# Patient Record
Sex: Female | Born: 1935 | Race: White | Hispanic: No | Smoking: Never smoker
Health system: Southern US, Community
[De-identification: ages and names within clinical notes are randomized; demographics above are authoritative.]

## PROBLEM LIST (undated history)

## (undated) DIAGNOSIS — J309 Allergic rhinitis, unspecified: Secondary | ICD-10-CM

## (undated) DIAGNOSIS — E119 Type 2 diabetes mellitus without complications: Secondary | ICD-10-CM

## (undated) DIAGNOSIS — E0590 Thyrotoxicosis, unspecified without thyrotoxic crisis or storm: Secondary | ICD-10-CM

## (undated) DIAGNOSIS — J329 Chronic sinusitis, unspecified: Secondary | ICD-10-CM

## (undated) DIAGNOSIS — I10 Essential (primary) hypertension: Secondary | ICD-10-CM

## (undated) DIAGNOSIS — M797 Fibromyalgia: Secondary | ICD-10-CM

## (undated) DIAGNOSIS — M47816 Spondylosis without myelopathy or radiculopathy, lumbar region: Secondary | ICD-10-CM

## (undated) HISTORY — DX: Type 2 diabetes mellitus without complications: E11.9

## (undated) HISTORY — DX: Thyrotoxicosis, unspecified without thyrotoxic crisis or storm: E05.90

## (undated) HISTORY — DX: Chronic sinusitis, unspecified: J32.9

## (undated) HISTORY — DX: Fibromyalgia: M79.7

## (undated) HISTORY — DX: Essential (primary) hypertension: I10

## (undated) HISTORY — DX: Allergic rhinitis, unspecified: J30.9

## (undated) HISTORY — DX: Spondylosis without myelopathy or radiculopathy, lumbar region: M47.816

---

## 1964-07-20 HISTORY — PX: PARTIAL HYSTERECTOMY: SHX80

## 1978-07-20 HISTORY — PX: OTHER SURGICAL HISTORY: SHX169

## 1984-07-20 HISTORY — PX: HEMORRHOID SURGERY: SHX153

## 1992-07-20 HISTORY — PX: GALLBLADDER SURGERY: SHX652

## 1994-07-20 HISTORY — PX: CERVICAL FUSION: SHX112

## 2001-07-20 HISTORY — PX: CATARACT EXTRACTION, BILATERAL: SHX1313

## 2009-07-20 HISTORY — PX: BACK SURGERY: SHX140

## 2012-07-20 HISTORY — PX: OTHER SURGICAL HISTORY: SHX169

## 2013-07-20 HISTORY — PX: REFRACTIVE SURGERY: SHX103

## 2013-12-19 ENCOUNTER — Encounter: Payer: Self-pay | Admitting: Internal Medicine

## 2014-06-05 ENCOUNTER — Encounter: Payer: Self-pay | Admitting: Internal Medicine

## 2014-10-09 ENCOUNTER — Ambulatory Visit: Payer: Self-pay | Admitting: Otolaryngology

## 2014-10-23 ENCOUNTER — Encounter: Payer: Self-pay | Admitting: Internal Medicine

## 2014-10-23 ENCOUNTER — Encounter (INDEPENDENT_AMBULATORY_CARE_PROVIDER_SITE_OTHER): Payer: Self-pay

## 2014-10-23 ENCOUNTER — Institutional Professional Consult (permissible substitution): Payer: Self-pay | Admitting: Internal Medicine

## 2014-10-23 ENCOUNTER — Ambulatory Visit (INDEPENDENT_AMBULATORY_CARE_PROVIDER_SITE_OTHER): Payer: Medicare Other | Admitting: Internal Medicine

## 2014-10-23 VITALS — BP 140/62 | HR 66 | Temp 97.8°F | Ht 62.0 in | Wt 187.0 lb

## 2014-10-23 DIAGNOSIS — R05 Cough: Secondary | ICD-10-CM

## 2014-10-23 DIAGNOSIS — R911 Solitary pulmonary nodule: Secondary | ICD-10-CM | POA: Diagnosis not present

## 2014-10-23 DIAGNOSIS — R059 Cough, unspecified: Secondary | ICD-10-CM | POA: Insufficient documentation

## 2014-10-23 NOTE — Progress Notes (Signed)
Date: 10/23/2014  MRN# [id number] Renee King 1932/08/23  Referring Physician: Dr. Gregary Cromer Palma is a 81 y.o. old female seen in consultation for  3mm RUL nodule.   CC:  Chief Complaint  Patient presents with  . Advice Only    Self referral: patient had pneumo in Dec 2015. Pt currently on amox/clav 875/125. She since has recurrent non-productive cough. No chest tightness, wheezing or sob.    HPI:  Patient is a pleasant 81 year old female with past medical history of recurrent sinusitis, allergic rhinitis, hypertension, diabetes, hypothyroidism, seen in consultation for cough and incidental pulmonary nodule on chest CT. Patient states that since Christmas she's had a history of chronic sinusitis, she seen her primary care physician and ENT (Dr. Andee Poles), she has had multiple rounds of antibiotics only to have them restarted upon completion. Patient states that her symptoms of sinusitis are cough, nasal congestion, fatigue. She recently saw her ENT physician who ordered a neck and chest CT, neck CT showed chronic sinusitis chest CT showed 3 mm right upper lobe incidental nodule. Patient states that her sinusitis accept mainly during allergy season which is winter for her. Patient has no pets at home, she had carpeting in her basement of her house, but had the floors refinished and 2011, has not noticed any change in symptoms since then. Coughing sometimes wears her out and will induce some mild dyspnea on exertion and moderate amount of fatigue. She recently had another bout of sinusitis and was placed on Augmentin. Review of her medications show that she is currently also on lisinopril. Social history reviewed show that she is a never smoker but had prolonged secondhand smoke exposure from her husband for about 54 years. She previously worked as a [employer] Services operational officer, and bus Hospital doctor. As a child she had chronic episodes of "hayfever", and in her 30s and 40s she had "yearly bronchitis." She has a family  history significant for cancer: Brother-lung cancer; 2 sisters-breast cancer; mother-colon cancer. Patient himself stated that she had melanoma in the right arm had it surgically removed requiring 37 stitches.  PMHX:   Past Medical History  Diagnosis Date  . Allergic rhinitis   . Recurrent sinusitis   . DJD (degenerative joint disease), lumbar   . Fibromyalgia   . Hypertension   . Diabetes   . Hyperthyroidism   . Diabetes    Surgical Hx:  Past Surgical History  Procedure Laterality Date  . Partial hysterectomy  1966  . Complete hysterectomy  1980  . Hemorrhoid surgery  1986  . Gallbladder surgery  1994  . Cervical fusion  1996    L-1&L-2   . Cataract extraction, bilateral  2003    Dr. Elsie Lincoln  . Back surgery  2011  . Skin tag  2014    melanoma rt arm  . Refractive surgery  2015    right   Family Hx:  Family History  Problem Relation Age of Onset  . Breast cancer Sister   . Breast cancer Sister   . Lung cancer Brother   . Colon cancer Mother    Social Hx:   History  Substance Use Topics  . Smoking status: Never Smoker   . Smokeless tobacco: Never Used  . Alcohol Use: No   Medication:   No current outpatient prescriptions on file.    Allergies:  Mercury; Clindamycin hcl; Piroxicam; Pregabalin; Quinolones; Codeine; and Terfenadine  Review of Systems: Gen:  Denies  fever, sweats, chills HEENT: Proximal sinusitis and cough Cvc:  No dizziness, chest pain or heaviness Resp:   Off Gi: Denies swallowing difficulty, stomach pain, nausea or vomiting, diarrhea, constipation, bowel incontinence Gu:  Denies bladder incontinence, burning urine Ext:   No Joint pain, stiffness or swelling Skin: No skin rash, easy bruising or bleeding or hives Endoc:  No polyuria, polydipsia , polyphagia or weight change Psych: No depression, insomnia or hallucinations  Other:  All other systems negative  Physical Examination:   VS: BP 140/62 mmHg  Pulse 66  Temp(Src) 97.8 F (36.6  C) (Oral)  Ht 5\' 2"  (1.575 m)  Wt 187 lb (84.823 kg)  BMI 34.19 kg/m2  SpO2 93%  General Appearance: No distress  Neuro:without focal findings, mental status, speech normal, alert and oriented, cranial nerves 2-12 intact, reflexes normal and symmetric, sensation grossly normal  HEENT: PERRLA, EOM intact, no ptosis, no other lesions noticed; Mallampati 2 Pulmonary: normal breath sounds., diaphragmatic excursion normal.No wheezing, No rales;   Sputum Production: None  CardiovascularNormal S1,S2.  No m/r/g.  Abdominal aorta pulsation normal.    Abdomen: Benign, Soft, non-tender, No masses, hepatosplenomegaly, No lymphadenopathy Renal:  No costovertebral tenderness  GU:  No performed at this time. Endoc: No evident thyromegaly, no signs of acromegaly or Cushing features Skin:   warm, no rashes, no ecchymosis  Extremities: normal, no cyanosis, clubbing, no edema, warm with normal capillary refill. Other findings: None     Rad results: (The following images and results were reviewed by Dr. Dema SeverinMungal). CT Neck and Chest 10/09/14 FINDINGS: THORACIC INLET/BODY WALL:  No acute abnormality.  MEDIASTINUM:  Normal heart size. No pericardial effusion. There is extensive atherosclerosis, including the coronary arteries. Bulky calcified plaque involving the proximal left subclavian results in 50% or greater stenosis. No evidence of adenopathy.  LUNG WINDOWS:  No pneumonia. No edema, effusion, or pneumothorax. There is a single noncalcified pulmonary nodule in the right middle lobe measuring 3 mm. No diffuse nodularity to suggest metastatic disease from the patient's melanoma. A calcified (based on coronal reformats) granuloma noted in the left posterior lung.  UPPER ABDOMEN:  Partially visualized presumed cyst in the upper right kidney. Cholecystectomy.  OSSEOUS:  No acute fracture. No suspicious lytic or blastic lesions.  IMPRESSION: 1. No pneumonia or other explanation for  cough. 2. 3 mm right middle lobe pulmonary nodule. See Fleischner recommendations below. 3. Atherosclerosis with moderate to advanced proximal left subclavian stenosis from calcified plaque. 4. No neck mass, enlarged lymph nodes, or clear etiology of pain identified the soft tissues of the neck allowing for motion artifact. Chronic right maxillary sinusitis. Moderate multilevel disc degeneration and severe facet arthrosis in the cervical spine.    Assessment and Plan: A 81-year-old female with past medical history of chronic sinusitis found to have incidental right upper lobe lung nodule, 3 mm, seen for evaluation today. Incidental lung nodule, less than or equal to 3mm Incidental finding during workup for chronic sinusitis and cough. Low risk for malignancy given size and characteristics. Will plan for follow-up CT chest in one year.   Cough Multifactorial: Chronic sinusitis, cyclical  Other differential include: Prolonged exposure to tobacco, obstructive with restrictive lung disease, sleep apnea,  Patient with prolonged secondhand smoke exposure from her husband for 54 years, will further evaluate with pulmonary function testing in 6 minute walk test for any underlying obstructive or restrictive lung disease or could be adding to her cough. Review of her medication list shows that she is on lisinopril, if other methodologies does not yield a reason for  her cough will then need to consider stopping lisinopril and starting another blood pressure medication.     Updated Medication List Outpatient Encounter Prescriptions as of 10/23/2014  Medication Sig  . acetaminophen (TYLENOL) 325 MG tablet Take 325 mg by mouth every 4 (four) hours as needed.  . ALPRAZolam (XANAX) 0.5 MG tablet Take 0.5 mg by mouth at bedtime.  Marland Kitchen amoxicillin-clavulanate (AUGMENTIN) 875-125 MG per tablet Take 1 tablet by mouth 2 (two) times daily.  . Artificial Tear Ointment (ARTIFICIAL TEARS) ointment Place 1 drop into  both eyes as needed.  Marland Kitchen aspirin EC 81 MG tablet Take 81 mg by mouth daily.  . calcium citrate-vitamin D (CITRACAL+D) 315-200 MG-UNIT per tablet Take 1 tablet by mouth 2 (two) times daily.  . Cholecalciferol 1000 UNITS tablet Take 1 capsule by mouth once a week.  . fluticasone (FLONASE) 50 MCG/ACT nasal spray Place 2 sprays into both nostrils daily.  Marland Kitchen HYDROcodone-acetaminophen (NORCO) 10-325 MG per tablet Take 1 tablet by mouth every 6 (six) hours as needed.  . latanoprost (XALATAN) 0.005 % ophthalmic solution Place 1 drop into the right eye at bedtime.  Marland Kitchen lisinopril (PRINIVIL,ZESTRIL) 20 MG tablet Take 20 mg by mouth daily.  . Multiple Vitamins-Minerals (CENTRUM FLAVOR BURST ADULT PO) Take 1 each by mouth daily.  Marland Kitchen omeprazole (PRILOSEC) 40 MG capsule Take 40 mg by mouth every morning.  . sucralfate (CARAFATE) 1 G tablet Take 1 g by mouth 4 (four) times daily as needed.  Marland Kitchen SYNTHROID 100 MCG tablet Take 100 mcg by mouth every morning.  . [DISCONTINUED] brimonidine (ALPHAGAN) 0.15 % ophthalmic solution Apply 1 drop to eye 2 (two) times daily.    Orders for this visit: No orders of the defined types were placed in this encounter.     Thank  you for the consultation and for allowing [facility] to assist in the care of your patient. Our recommendations are noted above.  Please contact us if we can be of further service.   Stephanie Acre, MD  Pulmonary and Critical Care Office Number: [phone]

## 2014-10-23 NOTE — Assessment & Plan Note (Signed)
Incidental finding during workup for chronic sinusitis and cough. Low risk for malignancy given size and characteristics. Will plan for follow-up CT chest in one year.

## 2014-10-23 NOTE — Patient Instructions (Addendum)
Follow up with Dr. Dema SeverinMungal in 6 weeks - pulmonary function testing and 6 minute walk test prior to follow up - 3mm nodule is incidental, we will plan for a follow up CT chest in 1 year.  - we will fax a copy of today's visit to Dr. Andee PolesVaught and your PMD (Dr. Leary RocaMahoney) - continue with your current treatment for sinusitis.

## 2014-10-23 NOTE — Assessment & Plan Note (Signed)
Multifactorial: Chronic sinusitis, cyclical  Other differential include: Prolonged exposure to tobacco, obstructive with restrictive lung disease, sleep apnea,  Patient with prolonged secondhand smoke exposure from her husband for 54 years, will further evaluate with pulmonary function testing in 6 minute walk test for any underlying obstructive or restrictive lung disease or could be adding to her cough. Review of her medication list shows that she is on lisinopril, if other methodologies does not yield a reason for her cough will then need to consider stopping lisinopril and starting another blood pressure medication.

## 2014-10-25 ENCOUNTER — Other Ambulatory Visit: Payer: Self-pay | Admitting: *Deleted

## 2014-10-26 ENCOUNTER — Other Ambulatory Visit: Payer: Self-pay | Admitting: *Deleted

## 2014-10-26 DIAGNOSIS — R059 Cough, unspecified: Secondary | ICD-10-CM

## 2014-10-26 DIAGNOSIS — R05 Cough: Secondary | ICD-10-CM

## 2014-10-26 NOTE — Addendum Note (Signed)
Addended by: Alease FrameARTER, Gayla Benn S on: 10/26/2014 08:41 AM   Modules accepted: Orders

## 2014-11-26 ENCOUNTER — Telehealth: Payer: Self-pay | Admitting: Internal Medicine

## 2014-11-26 NOTE — Telephone Encounter (Signed)
No voicemail.  Message says "your party is not answering please try your call later"  Will call back.

## 2014-11-27 NOTE — Telephone Encounter (Signed)
Called and spoke to pt. Pt stated she has to cancel her appt with VM but make the appt for her CT scan. Per last OV note with VM the pt is to have a CT scan in one year but f/u with VM in 6 weeks with 6MWT and PFT. Informed pt of the requesting f/u time with VM. Pt stated she will need coordinate transportation then call us back. Will await call.

## 2014-11-29 NOTE — Telephone Encounter (Signed)
Patient says she is having trouble finding someone to drive her to her appointment.  She lives 2 hours away.  Her daughter usually brings her but she just had surgery on her neck and is unable to drive right now.  She said that it will be a while before she can make the appointment.  She said that she will call and schedule appointment when she can get a ride.  Nothing further needed.

## 2014-12-13 ENCOUNTER — Ambulatory Visit: Payer: No Typology Code available for payment source | Admitting: Internal Medicine

## 2014-12-13 ENCOUNTER — Ambulatory Visit: Payer: No Typology Code available for payment source

## 2016-09-18 IMAGING — CT CT CHEST W/O CM
2 of 4 series · 15 of 36 positions shown, 18 images · non-contrast
Comparison: None.

CLINICAL DATA: History of melanoma with throat pain and cough. No
contrast due to possible remote reaction.

EXAM:
CT CHEST WITHOUT CONTRAST
TECHNIQUE: Multidetector CT imaging of the chest was performed following the
standard protocol without IV contrast..

[Series 7: routine chest wo · axial · 0.64mm/px · z∈[-418,-163]mm · 12 of 61 slices shown, 15 images]
[im 5/61  mediastinal]
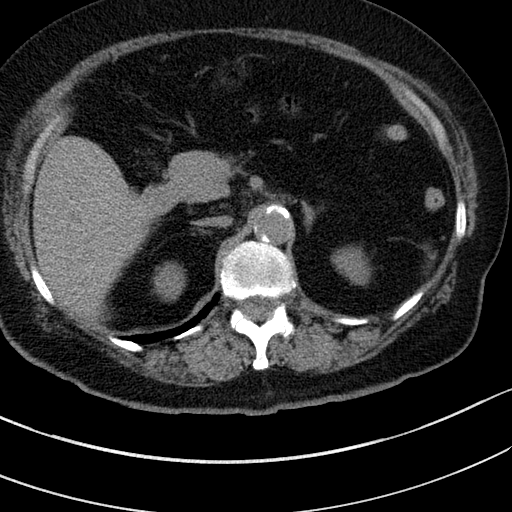
[im 5/61  lung]
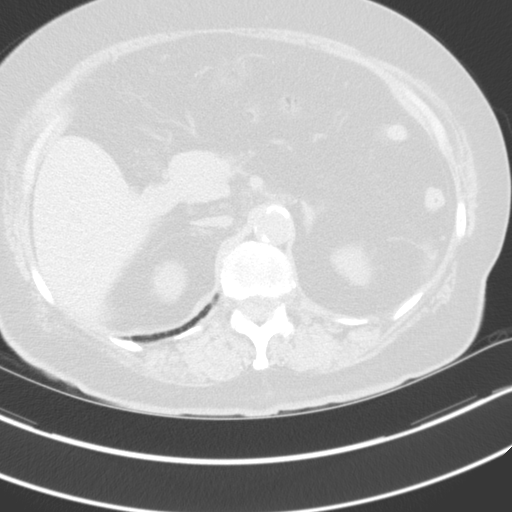
[im 10/61  lung]
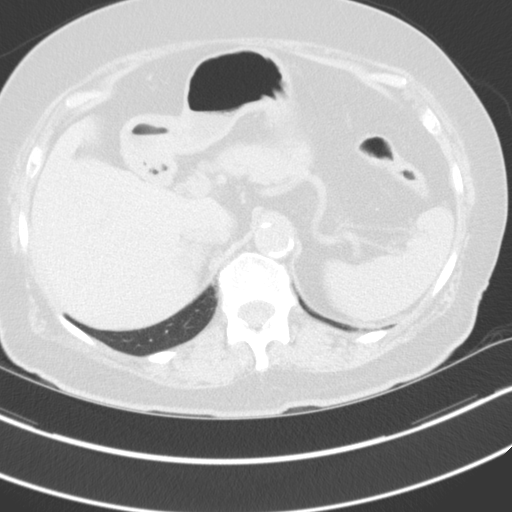
[im 14/61  lung]
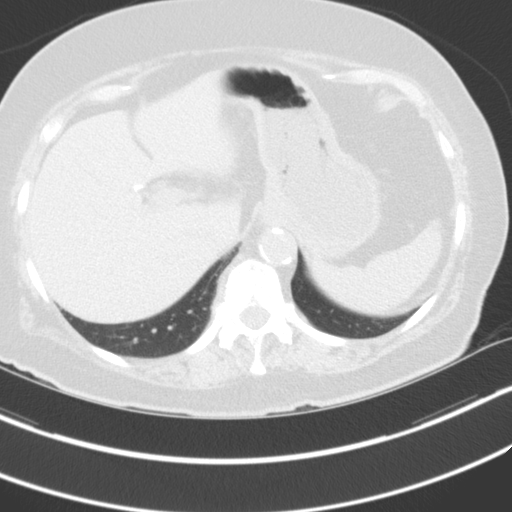
[im 19/61  lung]
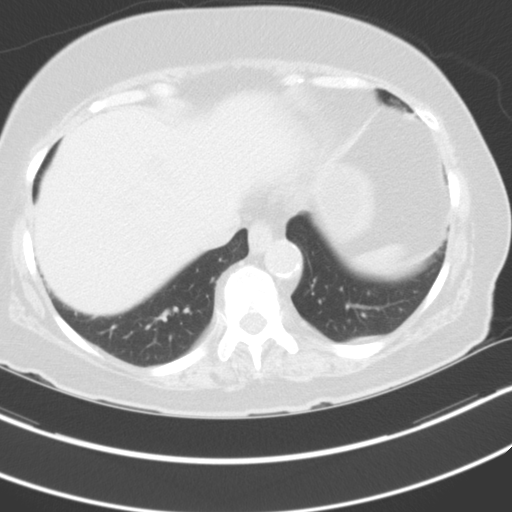
[im 24/61  mediastinal]
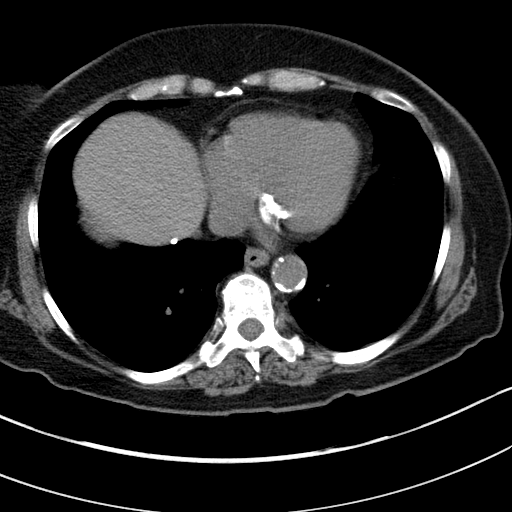
[im 24/61  lung]
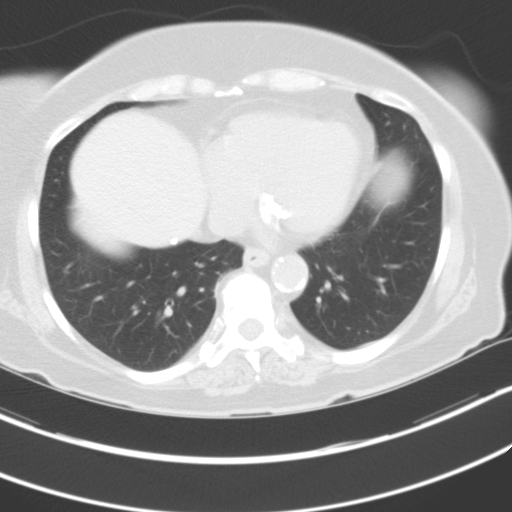
[im 28/61  lung]
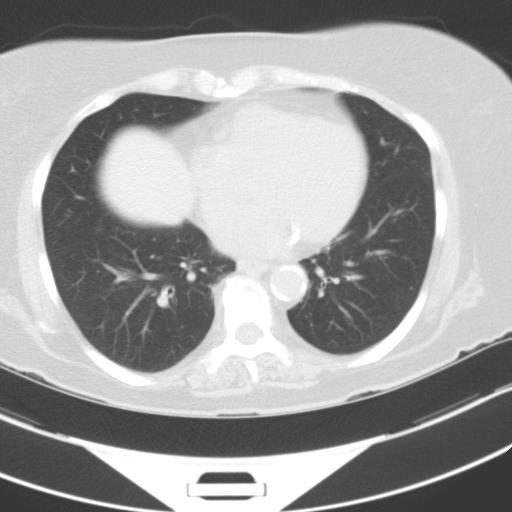
[im 33/61  lung]
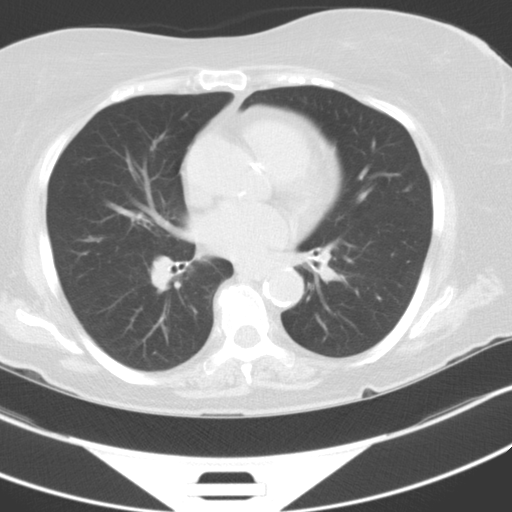
[im 37/61  lung]
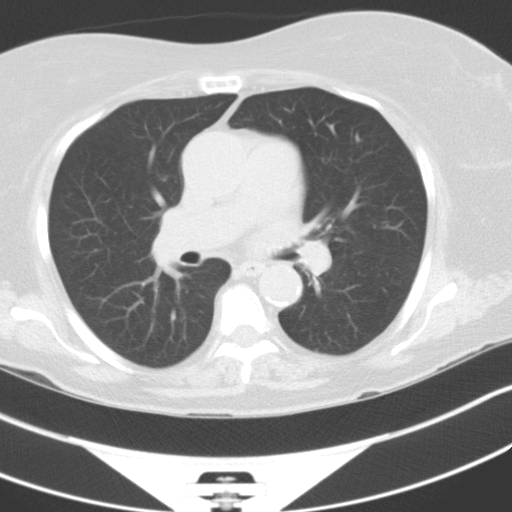
[im 42/61  mediastinal]
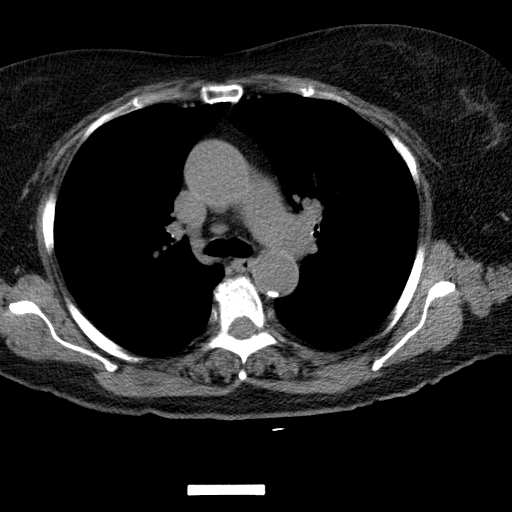
[im 42/61  lung]
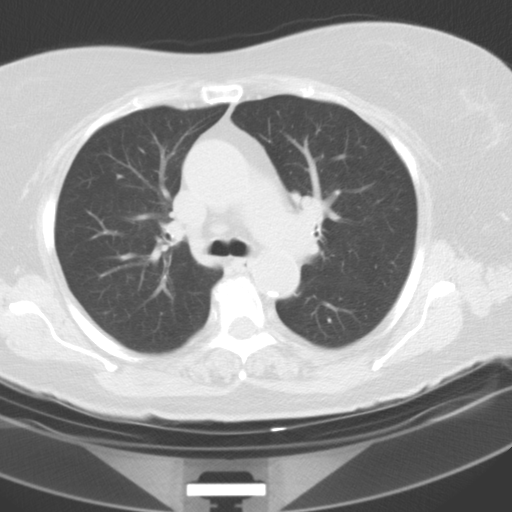
[im 47/61  lung]
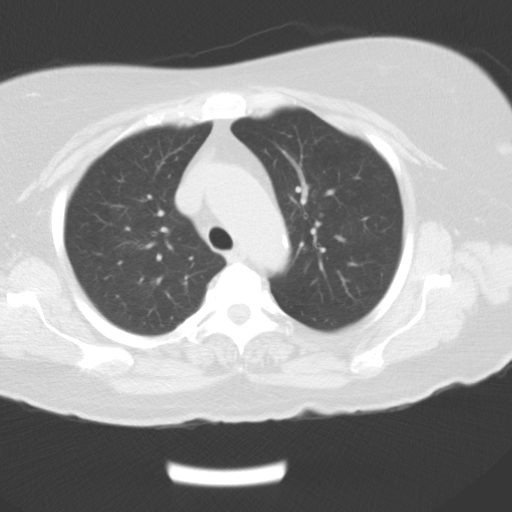
[im 51/61  lung]
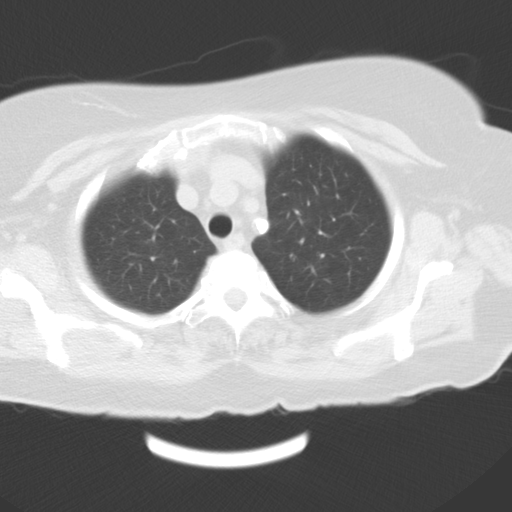
[im 56/61  lung]
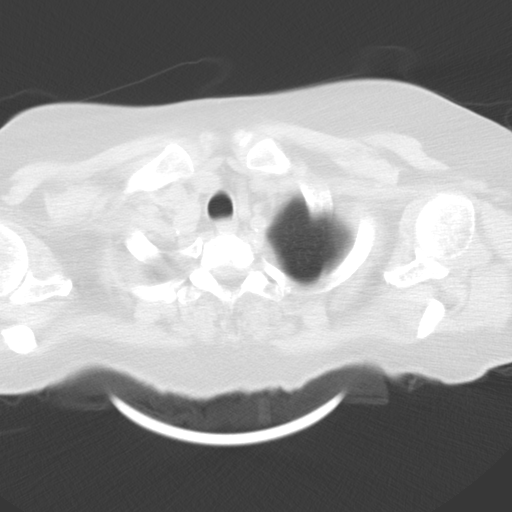

[Series 11: cor routine chest wo · coronal · 0.59mm/px · 3 of 109 slices shown]
[im 22/109  lung]
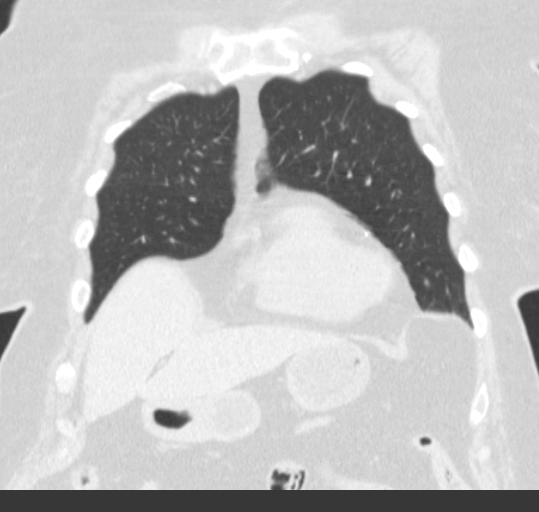
[im 44/109  lung]
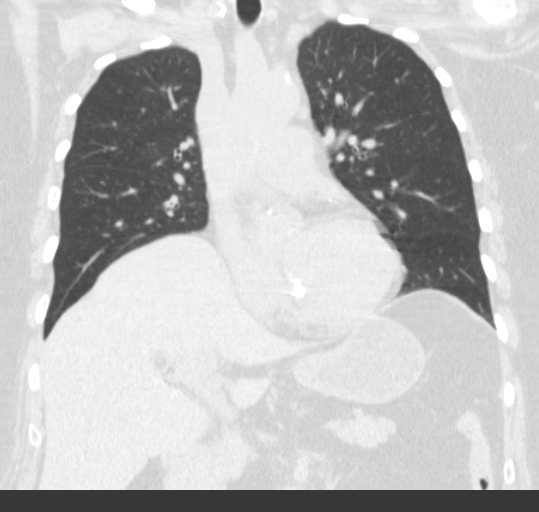
[im 65/109  lung]
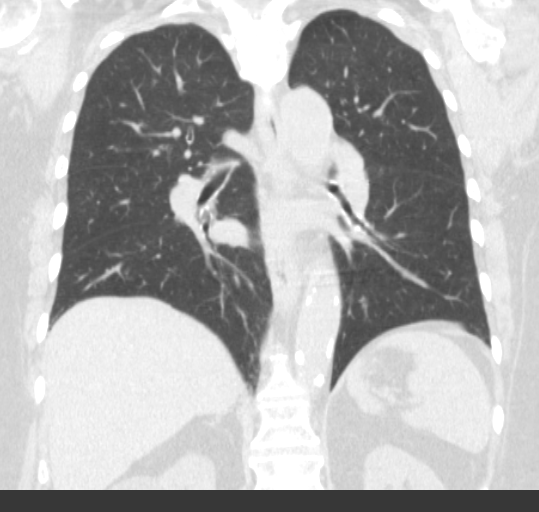

[15 of 36 positions shown; findings below may reference images not displayed]

FINDINGS: THORACIC INLET/BODY WALL:

No acute abnormality.

MEDIASTINUM:

Normal heart size. No pericardial effusion. There is extensive
atherosclerosis, including the coronary arteries. Bulky calcified
plaque involving the proximal left subclavian results in 50% or
greater stenosis. No evidence of adenopathy.

LUNG WINDOWS:

No pneumonia. No edema, effusion, or pneumothorax. There is a single
noncalcified pulmonary nodule in the right middle lobe measuring 3
mm. No diffuse nodularity to suggest metastatic disease from the
patient's melanoma. A calcified (based on coronal reformats)
granuloma noted in the left posterior lung.

UPPER ABDOMEN:

Partially visualized presumed cyst in the upper right kidney.
Cholecystectomy.

OSSEOUS:

No acute fracture.  No suspicious lytic or blastic lesions.
IMPRESSION: 1. No pneumonia or other explanation for cough.
2. 3 mm right middle lobe pulmonary nodule. See Anadu
recommendations below.
3. Atherosclerosis with moderate to advanced proximal left
subclavian stenosis from calcified plaque.

## 2023-02-18 DEATH — deceased
